# Patient Record
Sex: Female | Born: 1965 | Race: White | Hispanic: No | Smoking: Never smoker
Health system: Southern US, Community
[De-identification: ages and names within clinical notes are randomized; demographics above are authoritative.]

## PROBLEM LIST (undated history)

## (undated) DIAGNOSIS — E119 Type 2 diabetes mellitus without complications: Secondary | ICD-10-CM

## (undated) DIAGNOSIS — Z9641 Presence of insulin pump (external) (internal): Secondary | ICD-10-CM

## (undated) DIAGNOSIS — I10 Essential (primary) hypertension: Secondary | ICD-10-CM

## (undated) DIAGNOSIS — R569 Unspecified convulsions: Secondary | ICD-10-CM

## (undated) HISTORY — PX: SHOULDER SURGERY: SHX246

---

## 2017-07-19 ENCOUNTER — Emergency Department
Admission: EM | Admit: 2017-07-19 | Discharge: 2017-07-19 | Disposition: A | Discharge status: Home / Self Care | Payer: BLUE CROSS/BLUE SHIELD | Attending: Emergency Medicine | Admitting: Emergency Medicine

## 2017-07-19 ENCOUNTER — Encounter: Payer: Self-pay | Admitting: Emergency Medicine

## 2017-07-19 ENCOUNTER — Emergency Department: Payer: BLUE CROSS/BLUE SHIELD

## 2017-07-19 DIAGNOSIS — S63502A Unspecified sprain of left wrist, initial encounter: Secondary | ICD-10-CM | POA: Diagnosis not present

## 2017-07-19 DIAGNOSIS — E119 Type 2 diabetes mellitus without complications: Secondary | ICD-10-CM | POA: Insufficient documentation

## 2017-07-19 DIAGNOSIS — W010XXA Fall on same level from slipping, tripping and stumbling without subsequent striking against object, initial encounter: Secondary | ICD-10-CM | POA: Insufficient documentation

## 2017-07-19 DIAGNOSIS — Y998 Other external cause status: Secondary | ICD-10-CM | POA: Diagnosis not present

## 2017-07-19 DIAGNOSIS — Y929 Unspecified place or not applicable: Secondary | ICD-10-CM | POA: Insufficient documentation

## 2017-07-19 DIAGNOSIS — Z91040 Latex allergy status: Secondary | ICD-10-CM | POA: Insufficient documentation

## 2017-07-19 DIAGNOSIS — S6992XA Unspecified injury of left wrist, hand and finger(s), initial encounter: Secondary | ICD-10-CM | POA: Diagnosis present

## 2017-07-19 DIAGNOSIS — I10 Essential (primary) hypertension: Secondary | ICD-10-CM | POA: Diagnosis not present

## 2017-07-19 DIAGNOSIS — Y9301 Activity, walking, marching and hiking: Secondary | ICD-10-CM | POA: Insufficient documentation

## 2017-07-19 DIAGNOSIS — Z9641 Presence of insulin pump (external) (internal): Secondary | ICD-10-CM | POA: Diagnosis not present

## 2017-07-19 HISTORY — DX: Presence of insulin pump (external) (internal): Z96.41

## 2017-07-19 HISTORY — DX: Type 2 diabetes mellitus without complications: E11.9

## 2017-07-19 HISTORY — DX: Essential (primary) hypertension: I10

## 2017-07-19 HISTORY — DX: Unspecified convulsions: R56.9

## 2017-07-19 MED ORDER — CYCLOBENZAPRINE HCL 5 MG PO TABS: 5.0000 mg | ORAL_TABLET | Freq: Three times a day (TID) | ORAL | 0 refills | Status: AC | PRN

## 2017-07-19 MED ORDER — IBUPROFEN 800 MG PO TABS
800.0000 mg | ORAL_TABLET | Freq: Once | ORAL | Status: AC
  Administered 2017-07-19: 800 mg via ORAL
  Filled 2017-07-19: qty 1

## 2017-07-19 MED ORDER — NABUMETONE 750 MG PO TABS: 750.0000 mg | ORAL_TABLET | Freq: Two times a day (BID) | ORAL | 0 refills | Status: AC

## 2017-07-19 NOTE — ED Provider Notes (Signed)
[facility] ____________________________________________  Time seen: 2158  I have reviewed the triage vital signs and the nursing notes.  HISTORY  Chief Complaint  Fall  HPI Cathy BihariLori Sinko is a 51 y.o. female resistance to the ED for evaluation of left hand pain following the fall.She describes tripping while attempting to step up on the curb. She believes she landed with her left hand folded up against her breast. Since that time she has had pain with movement of the wrist and tenderness to the thumb at the wrist. She also sustained an abrasion to the forehead and left knee. She denies serious head injury or LOC.   Past Medical History:  Diagnosis Date  . Diabetes mellitus without complication (HCC)   . Hypertension   . Insulin pump in place   . Seizures (HCC)     There are no active problems to display for this patient.   Past Surgical History:  Procedure Laterality Date  . SHOULDER SURGERY Right     Prior to Admission medications   Medication Sig Start Date End Date Taking? Authorizing Provider  cyclobenzaprine (FLEXERIL) 5 MG tablet Take 1 tablet (5 mg total) by mouth 3 (three) times daily as needed for muscle spasms. 07/19/17   Ryon Layton, Charlesetta IvoryJenise V Bacon, PA-C  nabumetone (RELAFEN) 750 MG tablet Take 1 tablet (750 mg total) by mouth 2 (two) times daily. 07/19/17   Tywanna Seifer, Charlesetta IvoryJenise V Bacon, PA-C    Allergies Latex and Penicillins  No family history on file.  Social History Social History  Substance Use Topics  . Smoking status: Never Smoker  . Smokeless tobacco: Never Used  . Alcohol use No    Review of Systems  Constitutional: Negative for fever. Cardiovascular: Negative for chest pain. Respiratory: Negative for shortness of breath. Musculoskeletal: Negative for back pain. Left hand pain  Skin: Negative for rash. Neurological: Negative for headaches, focal weakness or  numbness. ____________________________________________  PHYSICAL EXAM:  VITAL SIGNS: ED Triage Vitals  Enc Vitals Group     BP 07/19/17 2116 (!) 167/73     Pulse Rate 07/19/17 2116 100     Resp 07/19/17 2116 20     Temp 07/19/17 2116 98.2 F (36.8 C)     Temp Source 07/19/17 2116 Oral     SpO2 07/19/17 2116 98 %     Weight 07/19/17 2117 250 lb (113.4 kg)     Height 07/19/17 2117 5\' 7"  (1.702 m)     Head Circumference --      Peak Flow --      Pain Score 07/19/17 2115 10     Pain Loc --      Pain Edu? --      Excl. in GC? --     Constitutional: Alert and oriented. Well appearing and in no distress. Head: Normocephalic and atraumatic. Cardiovascular: Normal rate, regular rhythm. Normal distal pulses. Respiratory: Normal respiratory effort. No wheezes/rales/rhonchi. Musculoskeletal: No obvious deformity, dislocation, or effusion of the left hand or wrist. Patient with a hand-held in a thumb opposition. She has normal composite fist and normal intrinsic testing. There is no acute snuffbox tenderness however she is tender over the thenar prominence and palm of the left hand. She also has pain at the wrist with pronation ROM. Elbow exam is benign with no medial/lateral epicondylar tenderness. Nontender with normal range of motion in all other extremities.  Neurologic:  Normal gross sensation.  Normal speech and language. No gross focal neurologic deficits  are appreciated. Skin:  Skin is warm, dry and intact. No rash noted. ____________________________________________   RADIOLOGY  Right Hand IMPRESSION: No fracture or dislocation.  No evident arthropathic change. ____________________________________________  PROCEDURES  IBU 800 mg PO  SPLINT APPLICATION Date/Time: 12:06 AM Authorized by: Lissa Hoard Consent: Verbal consent obtained. Risks and benefits: risks, benefits and alternatives were discussed Consent given by: patient Splint applied by: Brownie Gockel,  PA-C Location details: left UE Splint type: wrist cock-up with abducted thumb spica Supplies used: ortho glass Post-procedure: The splinted body part was neurovascularly unchanged following the procedure. Patient tolerance: Patient tolerated the procedure well with no immediate complications.____________________________________________  INITIAL IMPRESSION / ASSESSMENT AND PLAN / ED COURSE  Patient with the ED application of a custom OCL splint for wrist sprain and thumb tendinitis. X-rays negative for acute fracture or dislocation.  Her exam shows decrease range of motion with pronation and supination. Patient is splinted for comfort and is advised to follow-up with her local primary care provider or specialist, when she returns to her home town. She is discharged with prescriptions for Relafen and Flexeril dose as directed. She is also given splint care instructions. ____________________________________________  FINAL CLINICAL IMPRESSION(S) / ED DIAGNOSES  Final diagnoses:  Wrist sprain, left, initial encounter      Lissa Hoard, PA-C 07/20/17 0007    Phineas Semen, MD 07/20/17 1534

## 2017-07-19 NOTE — ED Notes (Signed)
Patient to ED for complaints of a fall. Tripped over a step and when she landed injured her left hand, left forehead and left knee. Denies LOC. No obvious deformity noted to the left hand or wrist.

## 2017-07-19 NOTE — ED Notes (Signed)
Patient verbalizes understanding of d/c instructions and follow-up. VS stable and pain controlled per patient.  Patient in NAD at time of d/c and denies further concerns regarding this visit. Patient stable at the time of departure from the unit, departing unit by the safest and most appropriate manner per that patients condition and limitations. Patient advised to return to the ED at any time for emergent concerns, or for new/worsening symptoms.   

## 2017-07-19 NOTE — ED Triage Notes (Signed)
See previous note for today.

## 2017-07-19 NOTE — Discharge Instructions (Signed)
Your x-ray was negative for any acute fracture or dislocation. You have a wrist sprain which has been treated with immobilization. Wear the wrist splint until your are evaluated by your local provider or specialist. Apply cold compresses over the splint. Take the prescription meds as directed.

## 2018-04-28 IMAGING — CR DG HAND COMPLETE 3+V*L*
3 series · 3 of 3 positions shown · non-contrast
Comparison: None.

CLINICAL DATA: Pain following fall

EXAM:
LEFT HAND - COMPLETE 3+ VIEW

[hand ap]
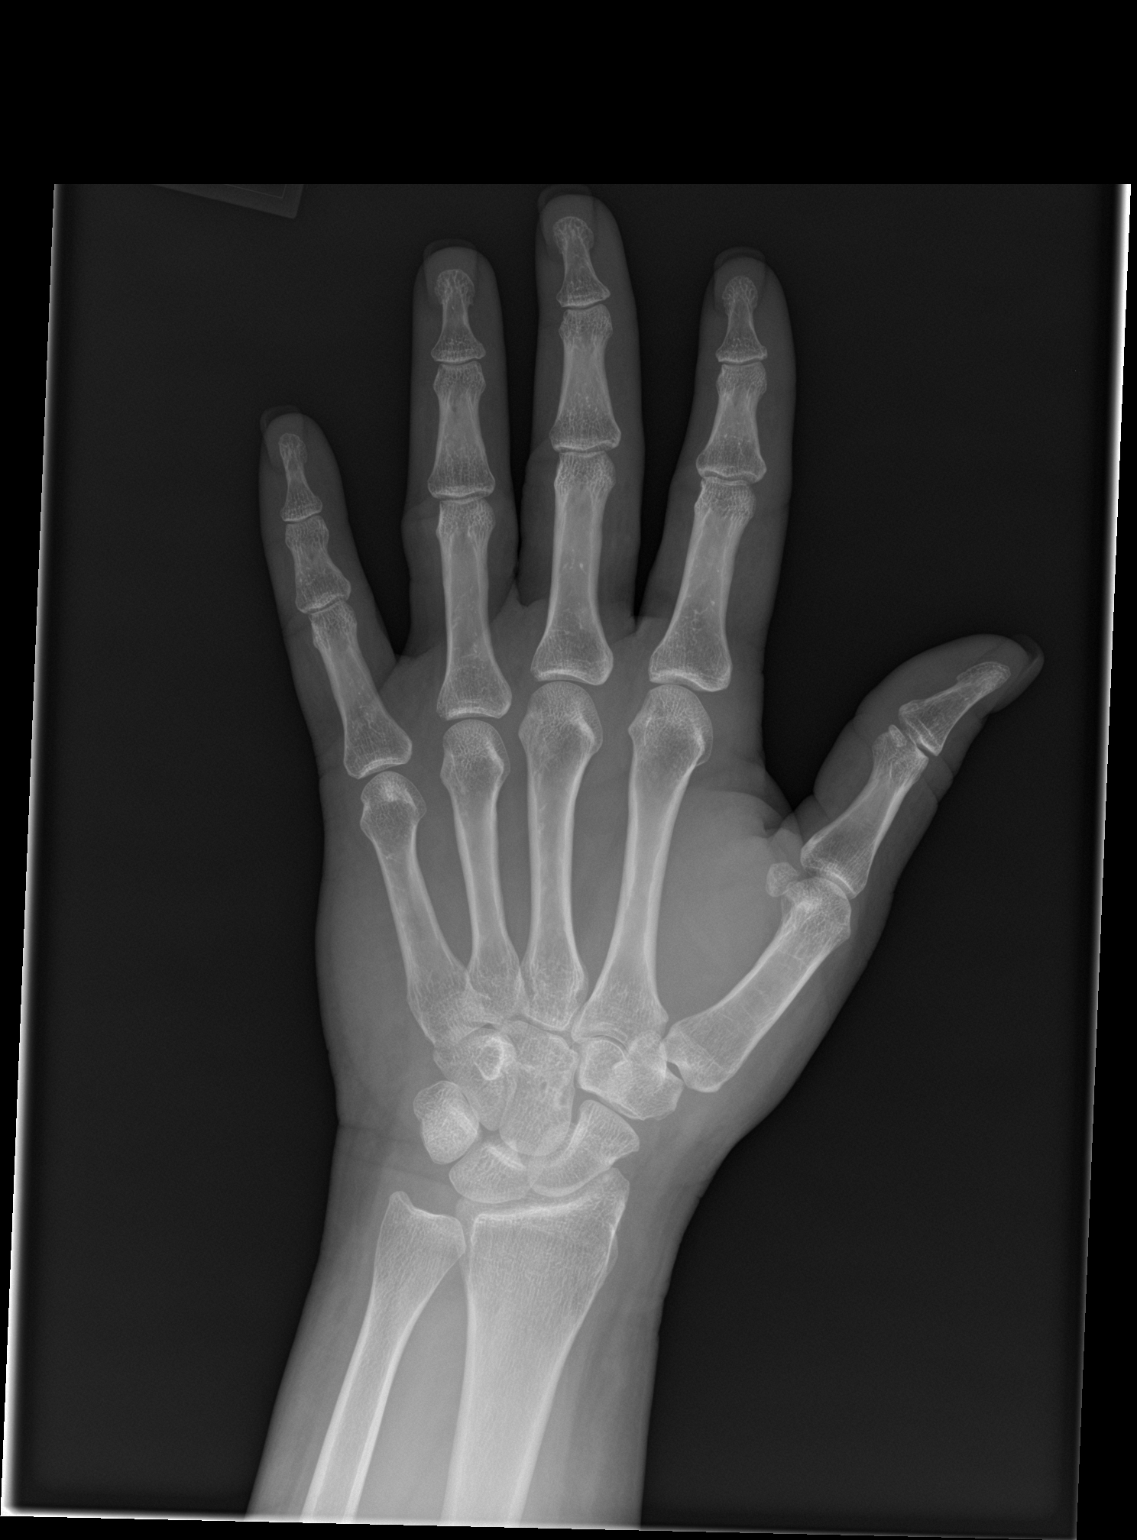

[hand obl]
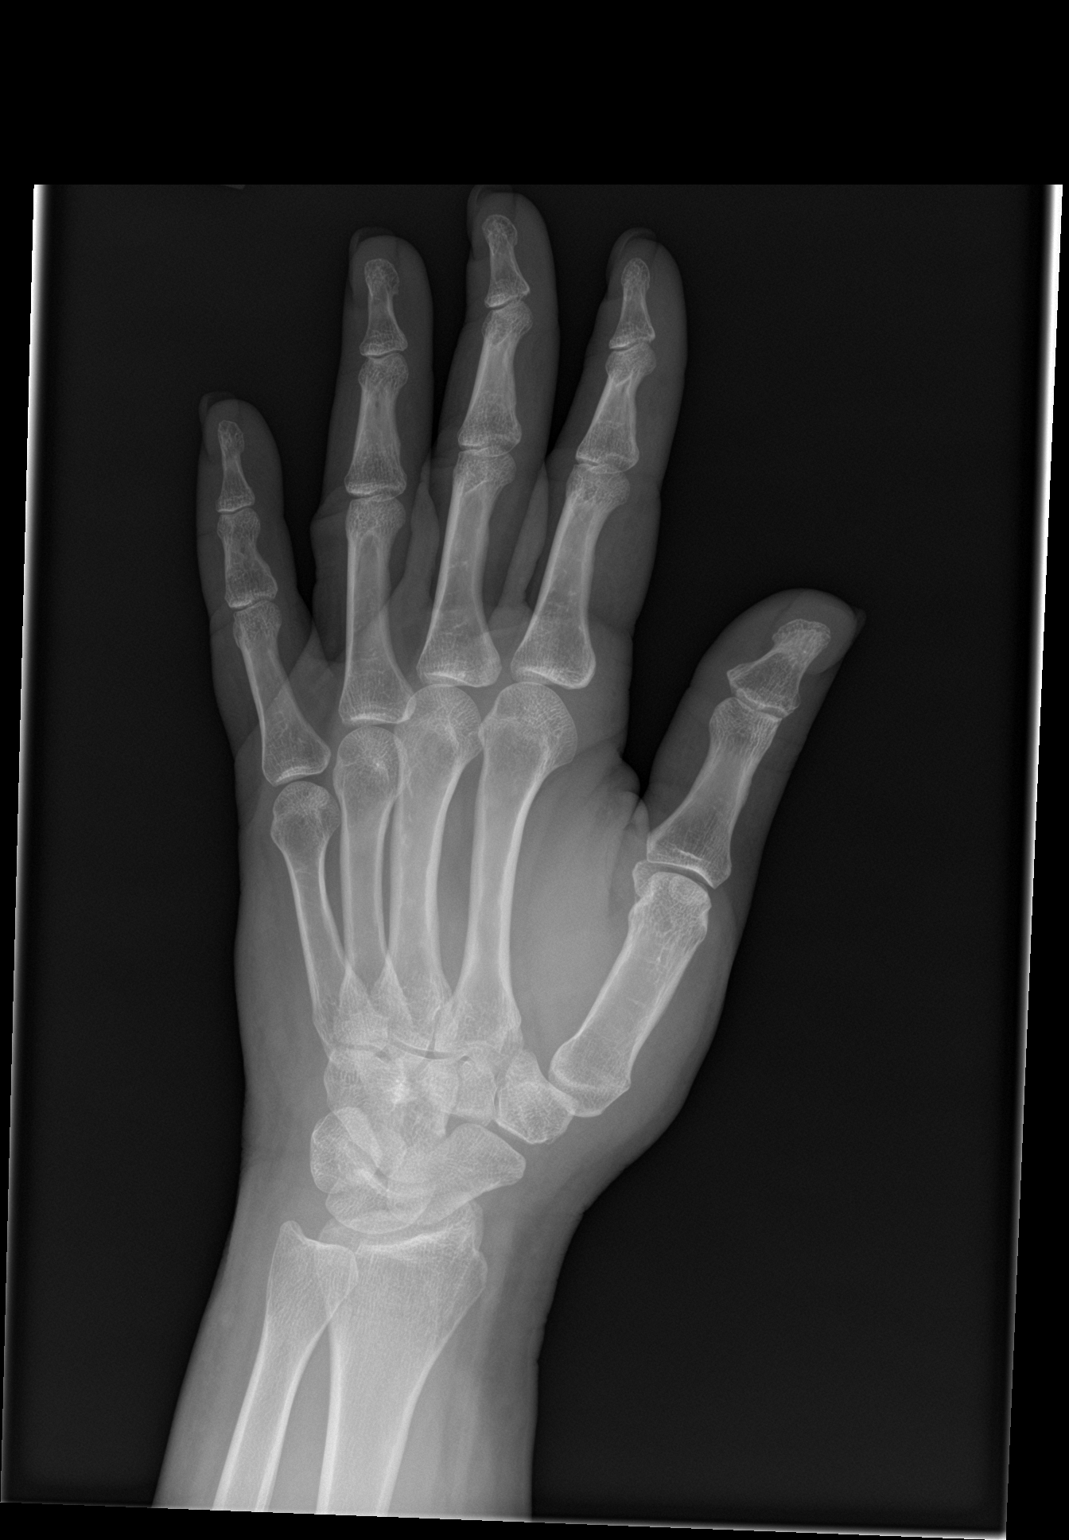

[hand lat]
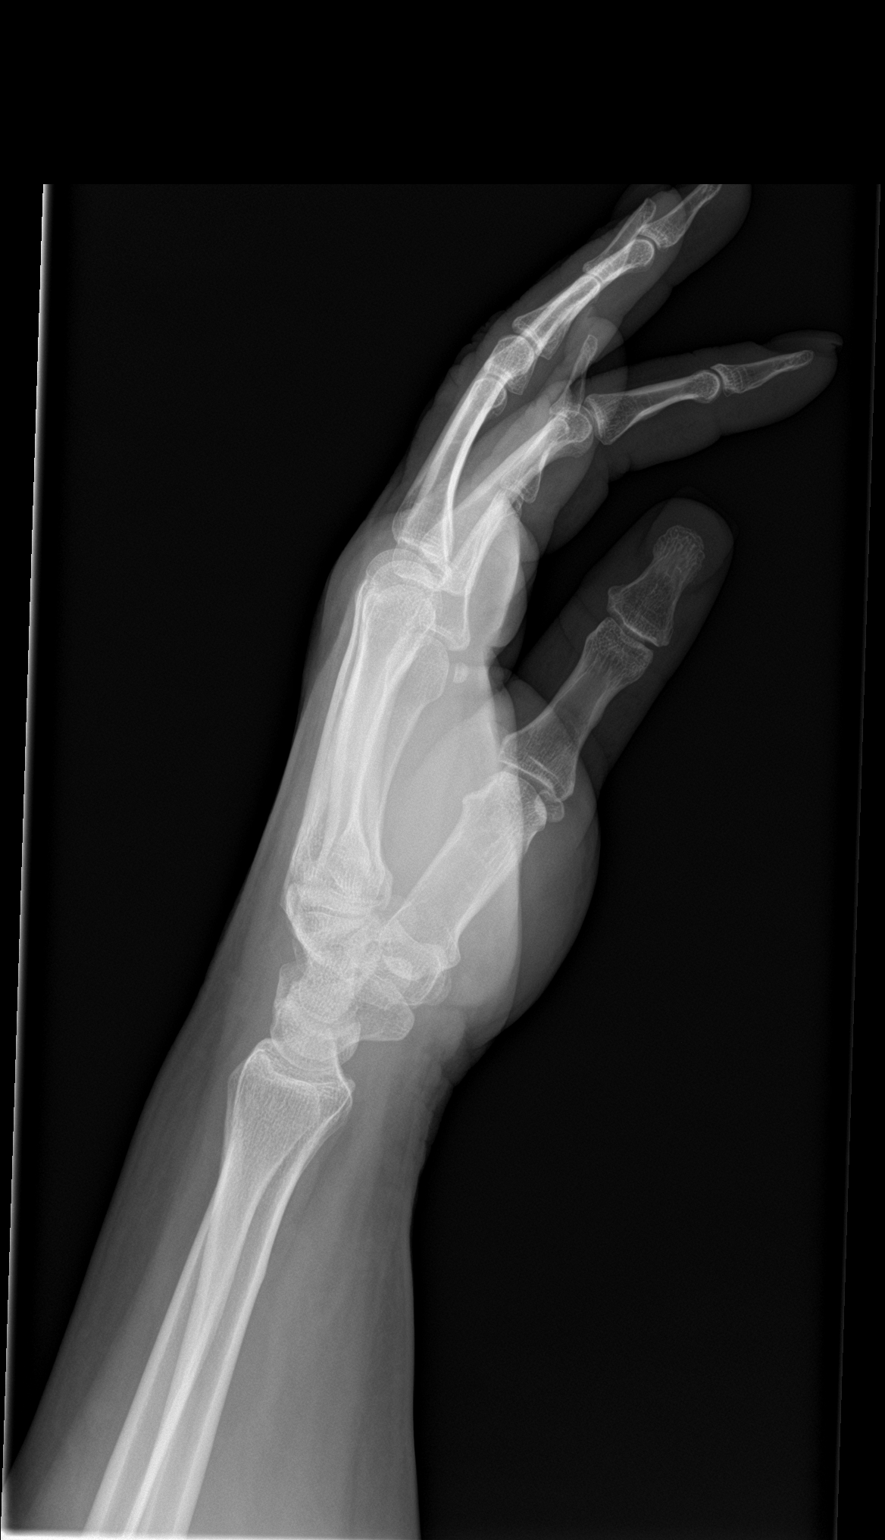

[3 of 3 positions shown; findings below may reference images not displayed]

FINDINGS: Frontal, oblique, and lateral views obtained. There is no fracture
or dislocation. Joint spaces appear normal. No erosive change.
IMPRESSION: No fracture or dislocation.  No evident arthropathic change.
